# Patient Record
Sex: Male | Born: 2000 | Hispanic: Yes | Marital: Single
Health system: Southern US, Community
[De-identification: ages and names within clinical notes are randomized; demographics above are authoritative.]

---
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
## 2019-08-22 IMAGING — CR DG CHEST 2V
2 series · 2 of 2 positions shown · non-contrast
Comparison: None.

CLINICAL DATA: Hit in the chest with a staple gun.

EXAM:
CHEST - 2 VIEW

[chest pa]
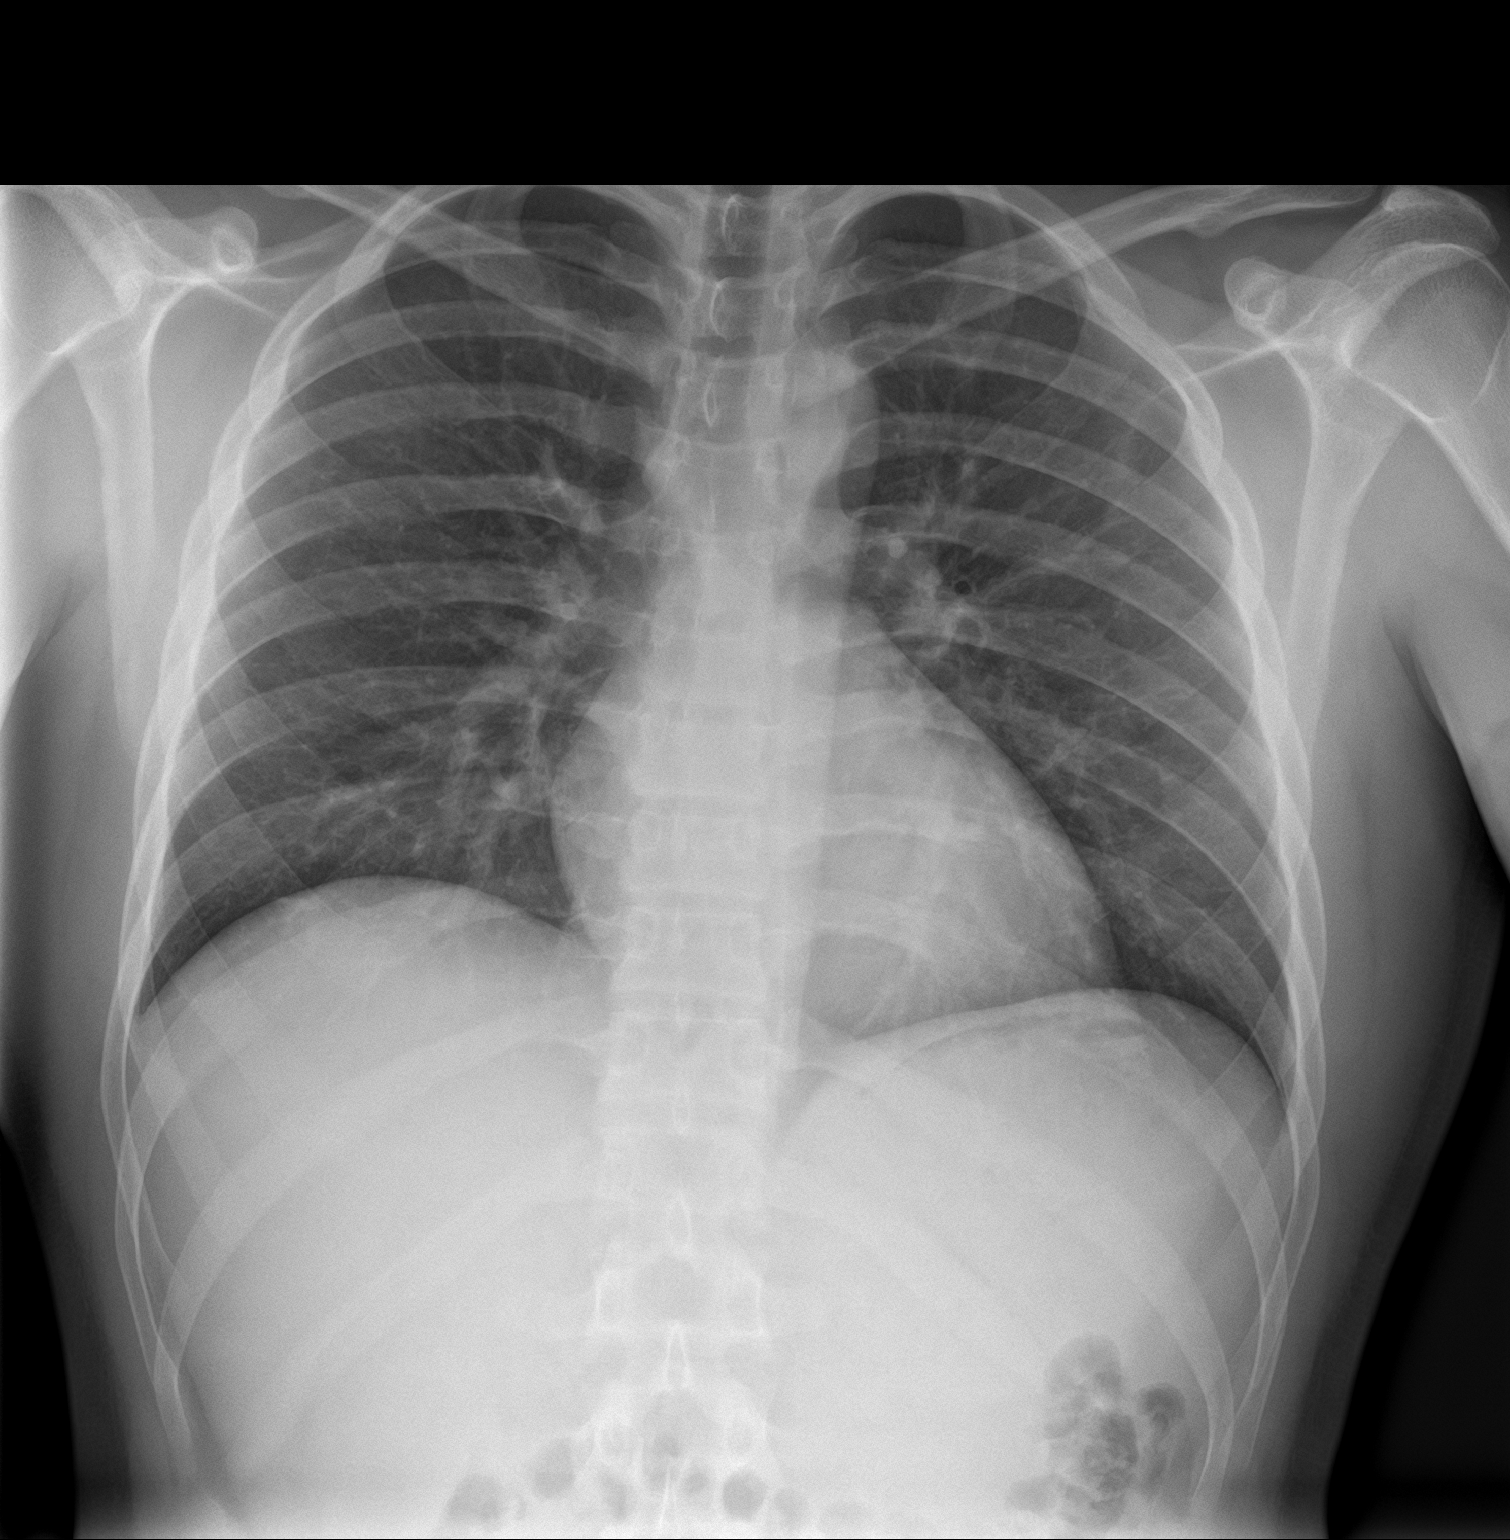

[chest lat]
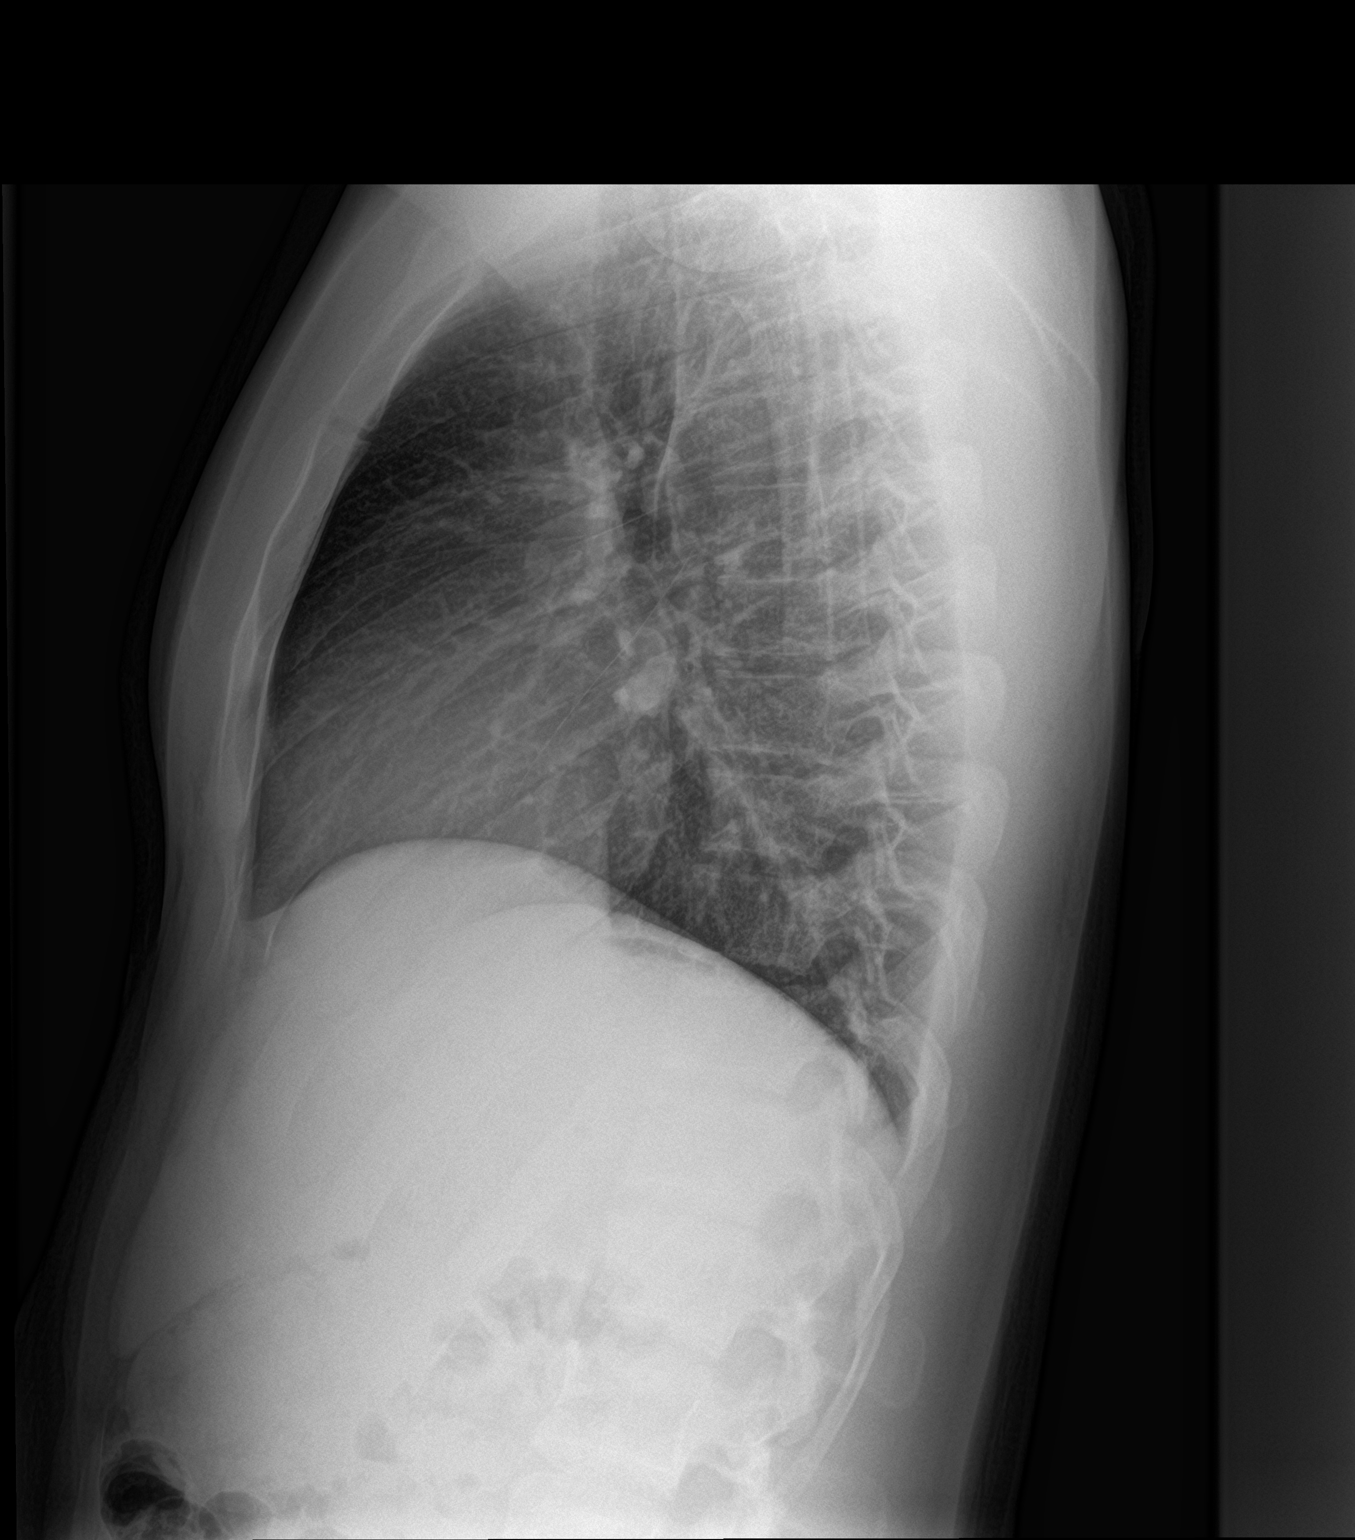

[2 of 2 positions shown; findings below may reference images not displayed]

FINDINGS: The heart size and mediastinal contours are within normal limits.
Both lungs are clear. The visualized skeletal structures are
unremarkable.
IMPRESSION: Normal chest x-ray.
